# Patient Record
Sex: Female | Born: 1945 | Race: White | Hispanic: No | Marital: Single
Health system: Southern US, Community
[De-identification: ages and names within clinical notes are randomized; demographics above are authoritative.]

---

## 2005-04-27 ENCOUNTER — Emergency Department (HOSPITAL_COMMUNITY): Admission: EM | Admit: 2005-04-27 | Discharge: 2005-04-28 | Payer: Self-pay | Admitting: Emergency Medicine

## 2005-05-09 ENCOUNTER — Emergency Department (HOSPITAL_COMMUNITY): Admission: EM | Admit: 2005-05-09 | Discharge: 2005-05-09 | Payer: Self-pay | Admitting: Emergency Medicine

## 2005-05-25 ENCOUNTER — Encounter: Admission: RE | Admit: 2005-05-25 | Discharge: 2005-07-26 | Payer: Self-pay | Admitting: Family Medicine

## 2006-02-01 ENCOUNTER — Ambulatory Visit: Payer: Self-pay | Admitting: Internal Medicine

## 2006-02-14 ENCOUNTER — Ambulatory Visit: Payer: Self-pay | Admitting: General Surgery

## 2006-03-12 ENCOUNTER — Ambulatory Visit: Payer: Self-pay | Admitting: General Surgery

## 2006-03-29 ENCOUNTER — Ambulatory Visit: Payer: Self-pay | Admitting: Oncology

## 2006-04-06 ENCOUNTER — Ambulatory Visit: Payer: Self-pay | Admitting: Oncology

## 2006-04-09 ENCOUNTER — Ambulatory Visit: Payer: Self-pay | Admitting: Gastroenterology

## 2006-04-23 ENCOUNTER — Ambulatory Visit: Payer: Self-pay | Admitting: Gastroenterology

## 2006-04-27 ENCOUNTER — Ambulatory Visit: Payer: Self-pay | Admitting: Oncology

## 2006-05-27 ENCOUNTER — Ambulatory Visit: Payer: Self-pay | Admitting: Oncology

## 2006-06-27 ENCOUNTER — Ambulatory Visit: Payer: Self-pay | Admitting: Oncology

## 2006-07-28 ENCOUNTER — Ambulatory Visit: Payer: Self-pay | Admitting: Oncology

## 2006-08-31 ENCOUNTER — Ambulatory Visit: Payer: Self-pay | Admitting: Oncology

## 2006-09-06 ENCOUNTER — Ambulatory Visit: Payer: Self-pay | Admitting: Oncology

## 2006-09-27 ENCOUNTER — Ambulatory Visit: Payer: Self-pay | Admitting: Oncology

## 2006-11-08 ENCOUNTER — Ambulatory Visit: Payer: Self-pay | Admitting: Oncology

## 2006-11-27 ENCOUNTER — Ambulatory Visit: Payer: Self-pay | Admitting: Oncology

## 2007-01-26 ENCOUNTER — Ambulatory Visit: Payer: Self-pay | Admitting: Internal Medicine

## 2007-01-31 ENCOUNTER — Ambulatory Visit: Payer: Self-pay | Admitting: Oncology

## 2007-02-05 ENCOUNTER — Ambulatory Visit: Payer: Self-pay | Admitting: Internal Medicine

## 2007-02-26 ENCOUNTER — Ambulatory Visit: Payer: Self-pay | Admitting: Oncology

## 2007-03-12 ENCOUNTER — Emergency Department: Payer: Self-pay

## 2007-03-17 ENCOUNTER — Emergency Department: Payer: Self-pay | Admitting: Emergency Medicine

## 2007-04-08 ENCOUNTER — Ambulatory Visit: Payer: Self-pay | Admitting: Urology

## 2007-04-26 ENCOUNTER — Ambulatory Visit: Payer: Self-pay | Admitting: Internal Medicine

## 2007-04-28 ENCOUNTER — Ambulatory Visit: Payer: Self-pay | Admitting: Oncology

## 2007-05-01 ENCOUNTER — Ambulatory Visit: Payer: Self-pay | Admitting: Oncology

## 2007-05-01 ENCOUNTER — Ambulatory Visit: Payer: Self-pay | Admitting: Rheumatology

## 2007-05-13 ENCOUNTER — Ambulatory Visit: Payer: Self-pay | Admitting: Gastroenterology

## 2007-05-17 ENCOUNTER — Ambulatory Visit: Payer: Self-pay | Admitting: Gastroenterology

## 2007-05-28 ENCOUNTER — Ambulatory Visit: Payer: Self-pay | Admitting: Oncology

## 2007-06-17 ENCOUNTER — Other Ambulatory Visit: Payer: Self-pay

## 2007-06-24 ENCOUNTER — Ambulatory Visit: Payer: Self-pay | Admitting: Gastroenterology

## 2007-06-28 ENCOUNTER — Ambulatory Visit: Payer: Self-pay | Admitting: Oncology

## 2007-07-04 ENCOUNTER — Ambulatory Visit: Payer: Self-pay | Admitting: Rheumatology

## 2007-07-05 ENCOUNTER — Emergency Department: Payer: Self-pay | Admitting: Emergency Medicine

## 2007-07-05 ENCOUNTER — Other Ambulatory Visit: Payer: Self-pay

## 2007-07-06 ENCOUNTER — Emergency Department: Payer: Self-pay | Admitting: Internal Medicine

## 2007-07-06 ENCOUNTER — Other Ambulatory Visit: Payer: Self-pay

## 2007-07-09 ENCOUNTER — Ambulatory Visit: Payer: Self-pay | Admitting: Internal Medicine

## 2007-07-29 ENCOUNTER — Ambulatory Visit: Payer: Self-pay | Admitting: Oncology

## 2007-08-02 ENCOUNTER — Ambulatory Visit: Payer: Self-pay | Admitting: Oncology

## 2007-08-28 ENCOUNTER — Ambulatory Visit: Payer: Self-pay | Admitting: Oncology

## 2007-10-28 ENCOUNTER — Ambulatory Visit: Payer: Self-pay | Admitting: Oncology

## 2007-11-01 ENCOUNTER — Ambulatory Visit: Payer: Self-pay | Admitting: Oncology

## 2007-11-24 ENCOUNTER — Emergency Department: Payer: Self-pay | Admitting: Emergency Medicine

## 2007-11-28 ENCOUNTER — Ambulatory Visit: Payer: Self-pay | Admitting: Oncology

## 2007-12-29 ENCOUNTER — Ambulatory Visit: Payer: Self-pay | Admitting: Oncology

## 2008-01-08 ENCOUNTER — Ambulatory Visit: Payer: Self-pay | Admitting: Otolaryngology

## 2008-01-26 ENCOUNTER — Ambulatory Visit: Payer: Self-pay | Admitting: Oncology

## 2008-03-27 ENCOUNTER — Ambulatory Visit: Payer: Self-pay | Admitting: Oncology

## 2010-05-27 ENCOUNTER — Ambulatory Visit: Payer: Self-pay | Admitting: Oncology

## 2010-06-23 ENCOUNTER — Ambulatory Visit: Payer: Self-pay | Admitting: Oncology

## 2010-06-27 ENCOUNTER — Ambulatory Visit: Payer: Self-pay | Admitting: Oncology

## 2010-08-08 ENCOUNTER — Ambulatory Visit: Payer: Self-pay | Admitting: Unknown Physician Specialty

## 2010-08-18 ENCOUNTER — Inpatient Hospital Stay: Payer: Self-pay | Admitting: Unknown Physician Specialty

## 2010-09-21 ENCOUNTER — Ambulatory Visit: Payer: Self-pay | Admitting: Oncology

## 2010-09-23 ENCOUNTER — Ambulatory Visit: Payer: Self-pay | Admitting: Oncology

## 2010-09-28 ENCOUNTER — Emergency Department: Payer: Self-pay | Admitting: Emergency Medicine

## 2010-09-29 ENCOUNTER — Emergency Department: Payer: Self-pay | Admitting: Emergency Medicine

## 2010-09-30 ENCOUNTER — Ambulatory Visit: Payer: Self-pay | Admitting: Oncology

## 2010-10-27 ENCOUNTER — Ambulatory Visit: Payer: Self-pay | Admitting: Oncology

## 2011-04-10 ENCOUNTER — Ambulatory Visit: Payer: Self-pay | Admitting: Oncology

## 2011-04-28 ENCOUNTER — Ambulatory Visit: Payer: Self-pay | Admitting: Oncology

## 2011-05-05 IMAGING — CR DG CHEST 2V
1 series · 2 of 2 positions shown · non-contrast
Comparison: none

REASON FOR EXAM: fever
COMMENTS:

[Series 1: view not recorded · 0.17mm/px · 2 of 2 slices shown]
[im 1/2]
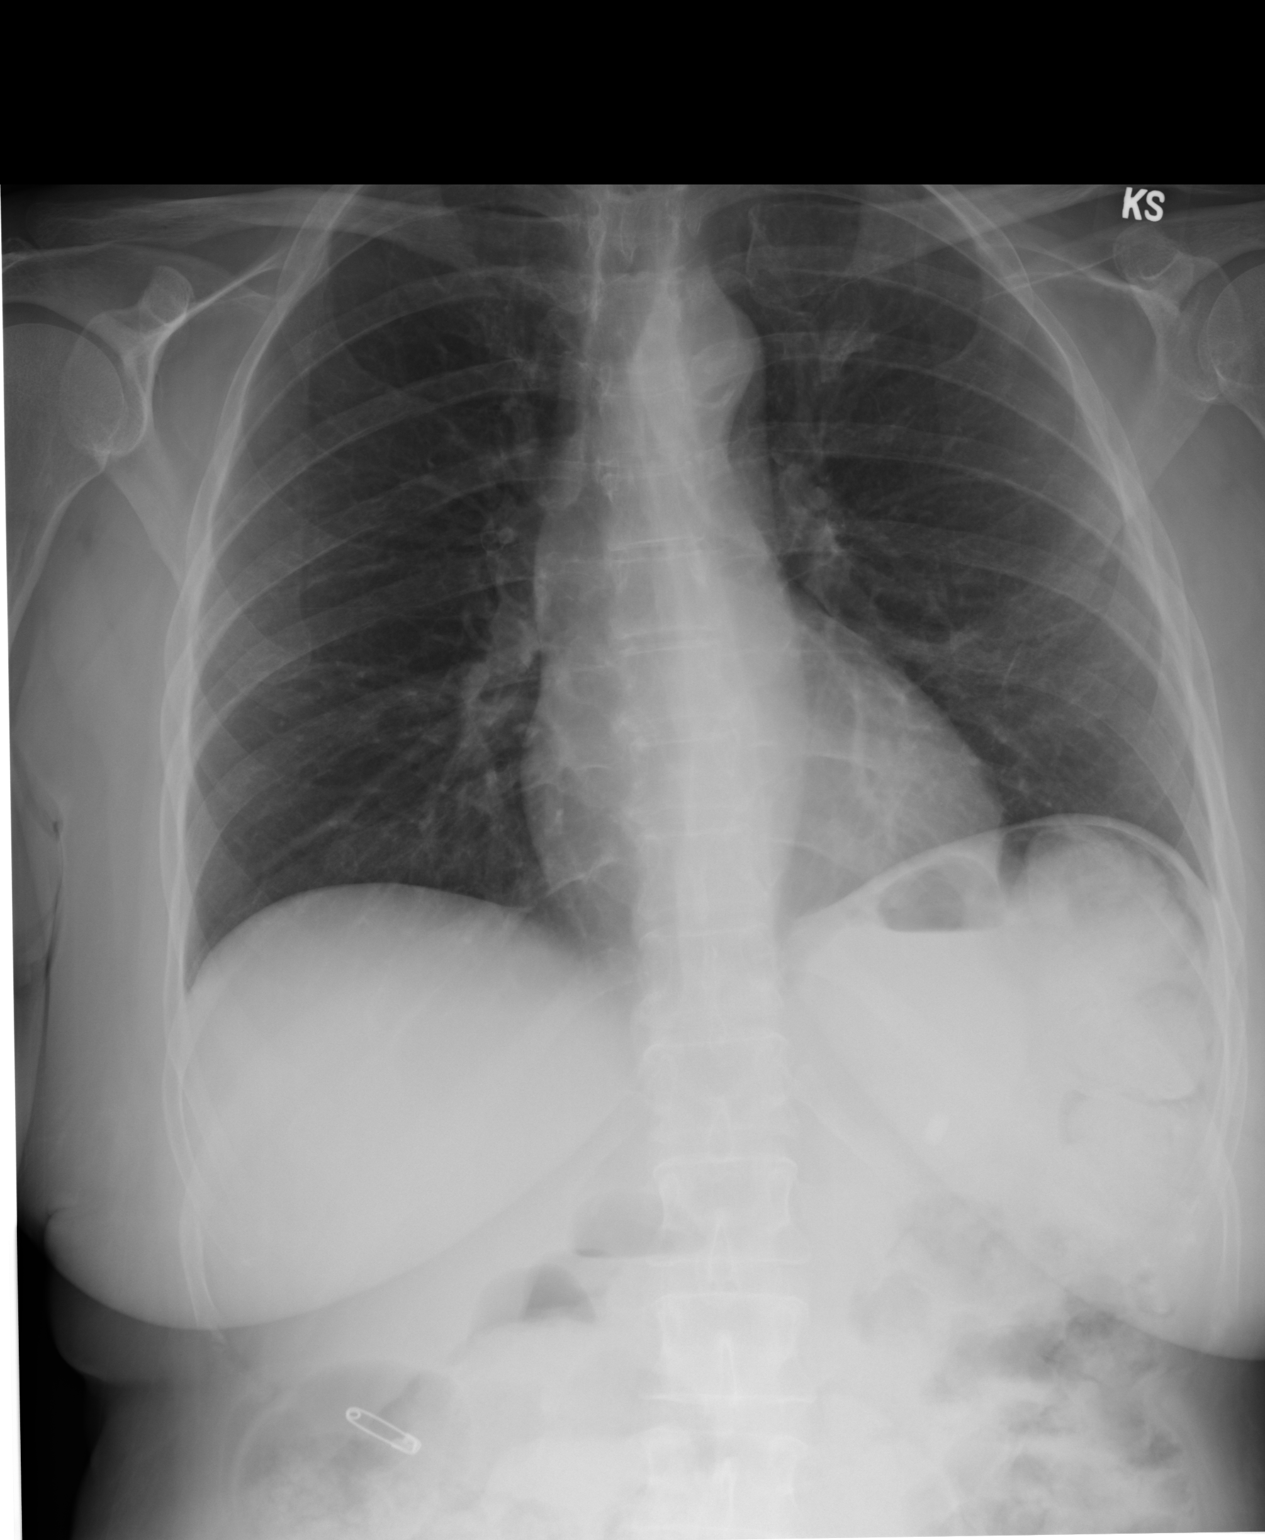
[im 2/2]
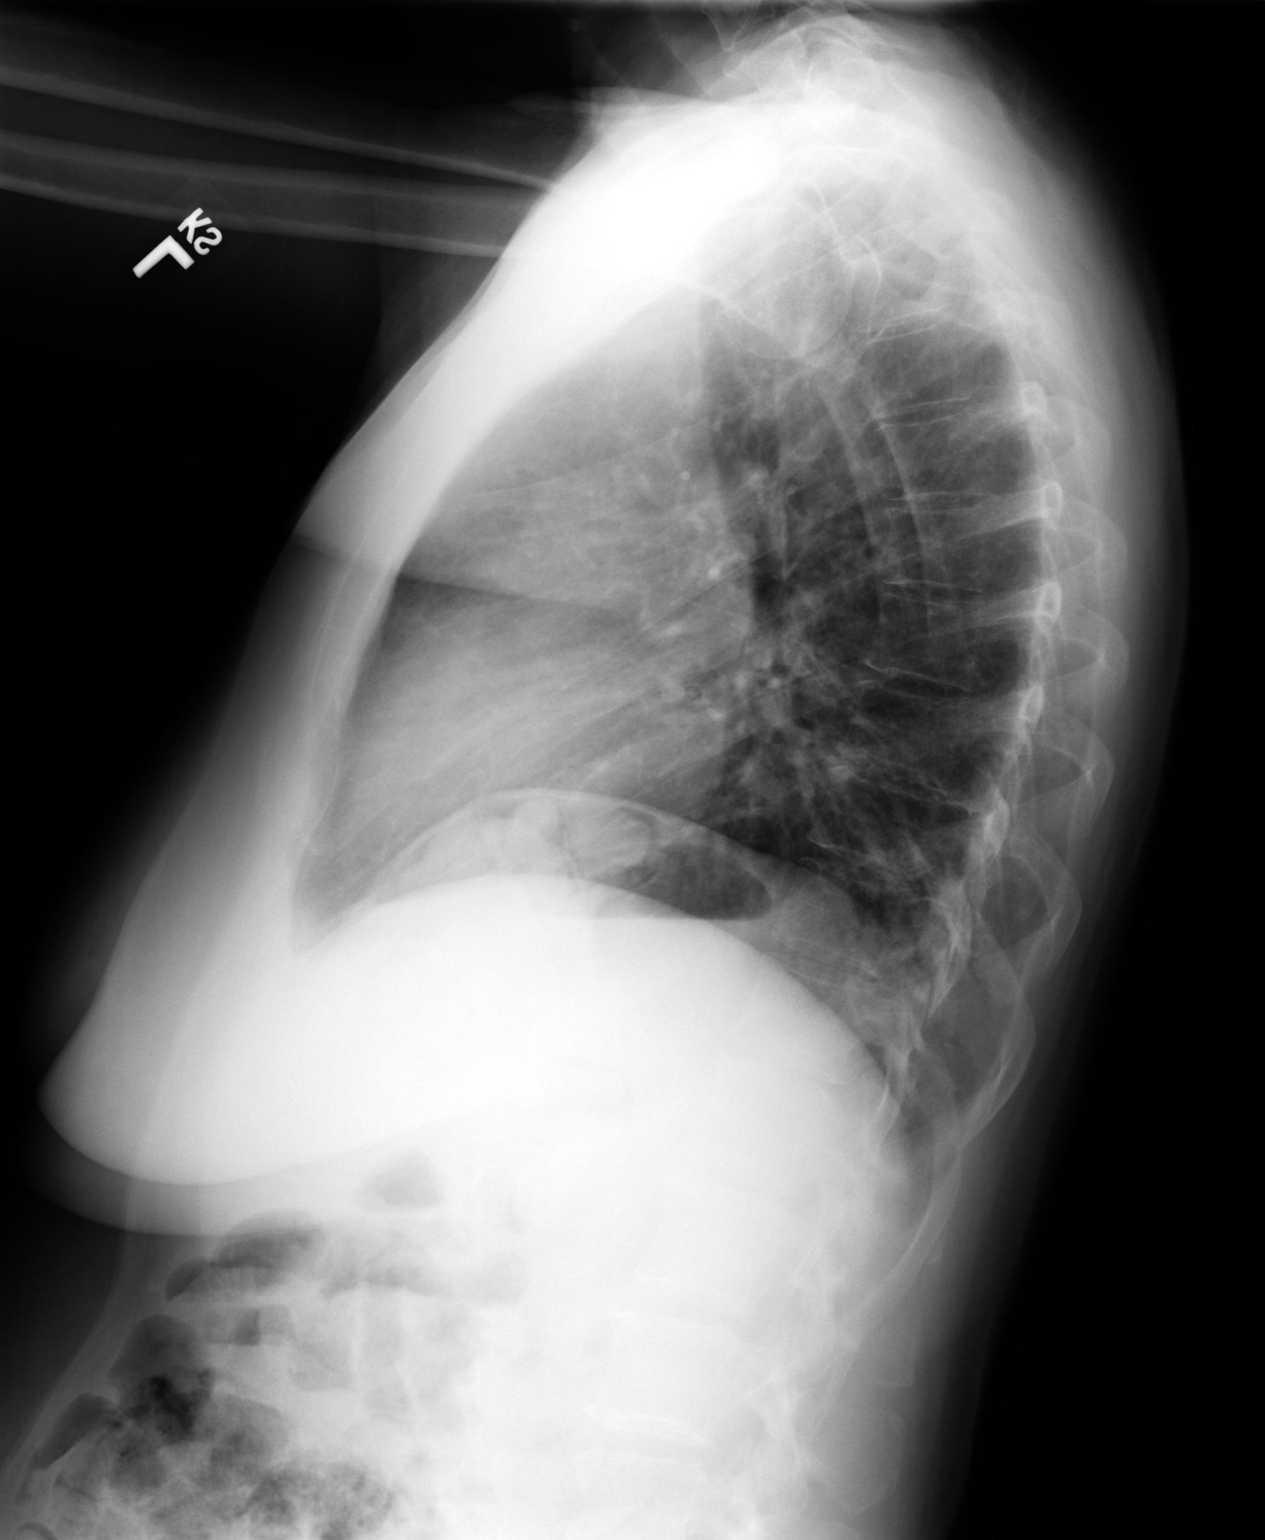

[2 of 2 positions shown; findings below may reference images not displayed]

PROCEDURE:     DXR - DXR CHEST PA (OR AP) AND LATERAL  - August 21, 2010  [DATE]

RESULT:     Comparison is made to the study dated 08 August, 2010.

The lungs are adequately inflated. There is no focal infiltrate. The cardiac
silhouette is normal in size. The pulmonary vascularity is not engorged.
There is no pleural effusion. The mediastinum is not abnormally widened.
IMPRESSION: I do not see evidence of pneumonia nor definite evidence of
other acute cardiopulmonary abnormality.

## 2011-07-17 ENCOUNTER — Ambulatory Visit: Payer: Self-pay | Admitting: Unknown Physician Specialty

## 2011-08-03 ENCOUNTER — Emergency Department: Payer: Self-pay | Admitting: *Deleted

## 2011-08-11 ENCOUNTER — Ambulatory Visit: Payer: Self-pay | Admitting: Oncology

## 2011-08-28 ENCOUNTER — Ambulatory Visit: Payer: Self-pay | Admitting: Oncology

## 2011-08-29 ENCOUNTER — Ambulatory Visit: Payer: Self-pay | Admitting: Oncology

## 2011-09-04 ENCOUNTER — Emergency Department: Payer: Self-pay | Admitting: Emergency Medicine

## 2011-09-20 ENCOUNTER — Emergency Department: Payer: Self-pay | Admitting: Emergency Medicine

## 2011-09-21 ENCOUNTER — Emergency Department: Payer: Self-pay | Admitting: Emergency Medicine

## 2011-09-28 ENCOUNTER — Ambulatory Visit: Payer: Self-pay | Admitting: Oncology

## 2011-11-14 ENCOUNTER — Ambulatory Visit: Payer: Self-pay | Admitting: Neurology

## 2011-11-17 ENCOUNTER — Ambulatory Visit: Payer: Self-pay | Admitting: Neurology

## 2011-11-29 ENCOUNTER — Ambulatory Visit: Payer: Self-pay | Admitting: Oncology

## 2011-11-29 LAB — COMPREHENSIVE METABOLIC PANEL
Albumin: 3.9 g/dL (ref 3.4–5.0)
Alkaline Phosphatase: 117 U/L (ref 50–136)
Anion Gap: 11 (ref 7–16)
BUN: 20 mg/dL — ABNORMAL HIGH (ref 7–18)
Calcium, Total: 8.7 mg/dL (ref 8.5–10.1)
Chloride: 103 mmol/L (ref 98–107)
Co2: 26 mmol/L (ref 21–32)
Creatinine: 0.88 mg/dL (ref 0.60–1.30)
Osmolality: 283 (ref 275–301)
Sodium: 140 mmol/L (ref 136–145)
Total Protein: 7.7 g/dL (ref 6.4–8.2)

## 2011-11-29 LAB — CBC CANCER CENTER
Basophil #: 0 x10 3/mm (ref 0.0–0.1)
Eosinophil #: 0 x10 3/mm (ref 0.0–0.7)
HGB: 13.6 g/dL (ref 12.0–16.0)
Lymphocyte #: 1.7 x10 3/mm (ref 1.0–3.6)
Lymphocyte %: 30.7 %
MCH: 30.7 pg (ref 26.0–34.0)
MCHC: 33.7 g/dL (ref 32.0–36.0)
Neutrophil #: 3.1 x10 3/mm (ref 1.4–6.5)
RDW: 14.2 % (ref 11.5–14.5)

## 2011-12-22 ENCOUNTER — Ambulatory Visit: Payer: Self-pay | Admitting: Unknown Physician Specialty

## 2011-12-29 ENCOUNTER — Ambulatory Visit: Payer: Self-pay | Admitting: Oncology

## 2012-01-17 ENCOUNTER — Other Ambulatory Visit: Payer: Self-pay | Admitting: Hematology & Oncology

## 2012-01-30 ENCOUNTER — Ambulatory Visit: Payer: Self-pay | Admitting: Oncology

## 2012-01-30 ENCOUNTER — Ambulatory Visit: Payer: Self-pay | Admitting: Gastroenterology

## 2012-01-30 LAB — COMPREHENSIVE METABOLIC PANEL
Alkaline Phosphatase: 95 U/L (ref 50–136)
Calcium, Total: 8.9 mg/dL (ref 8.5–10.1)
Co2: 28 mmol/L (ref 21–32)
EGFR (Non-African Amer.): >60
Glucose: 89 mg/dL (ref 65–99)
Osmolality: 279 (ref 275–301)
SGOT(AST): 12 U/L — ABNORMAL LOW (ref 15–37)
Sodium: 140 mmol/L (ref 136–145)

## 2012-01-30 LAB — CBC CANCER CENTER
Basophil #: 0.1 x10 3/mm (ref 0.0–0.1)
Eosinophil #: 0 x10 3/mm (ref 0.0–0.7)
HGB: 12.9 g/dL (ref 12.0–16.0)
MCH: 30.9 pg (ref 26.0–34.0)
MCHC: 34.3 g/dL (ref 32.0–36.0)
Monocyte #: 0.4 x10 3/mm (ref 0.0–0.7)
Neutrophil %: 64.2 %
Platelet: 190 x10 3/mm (ref 150–440)
RDW: 14 % (ref 11.5–14.5)

## 2012-01-31 ENCOUNTER — Ambulatory Visit: Payer: Self-pay | Admitting: Gastroenterology

## 2012-02-01 LAB — PATHOLOGY REPORT

## 2012-02-26 ENCOUNTER — Ambulatory Visit: Payer: Self-pay | Admitting: Oncology

## 2012-05-18 IMAGING — CR DG CHEST 1V PORT
1 series · 1 of 1 positions shown · non-contrast
Comparison: none

REASON FOR EXAM: altered mental status
COMMENTS:

PROCEDURE:     DXR - DXR PORTABLE CHEST SINGLE VIEW  - September 04, 2011  [DATE]
RESULT:     Comparison: 08/11/2011

[view not recorded]
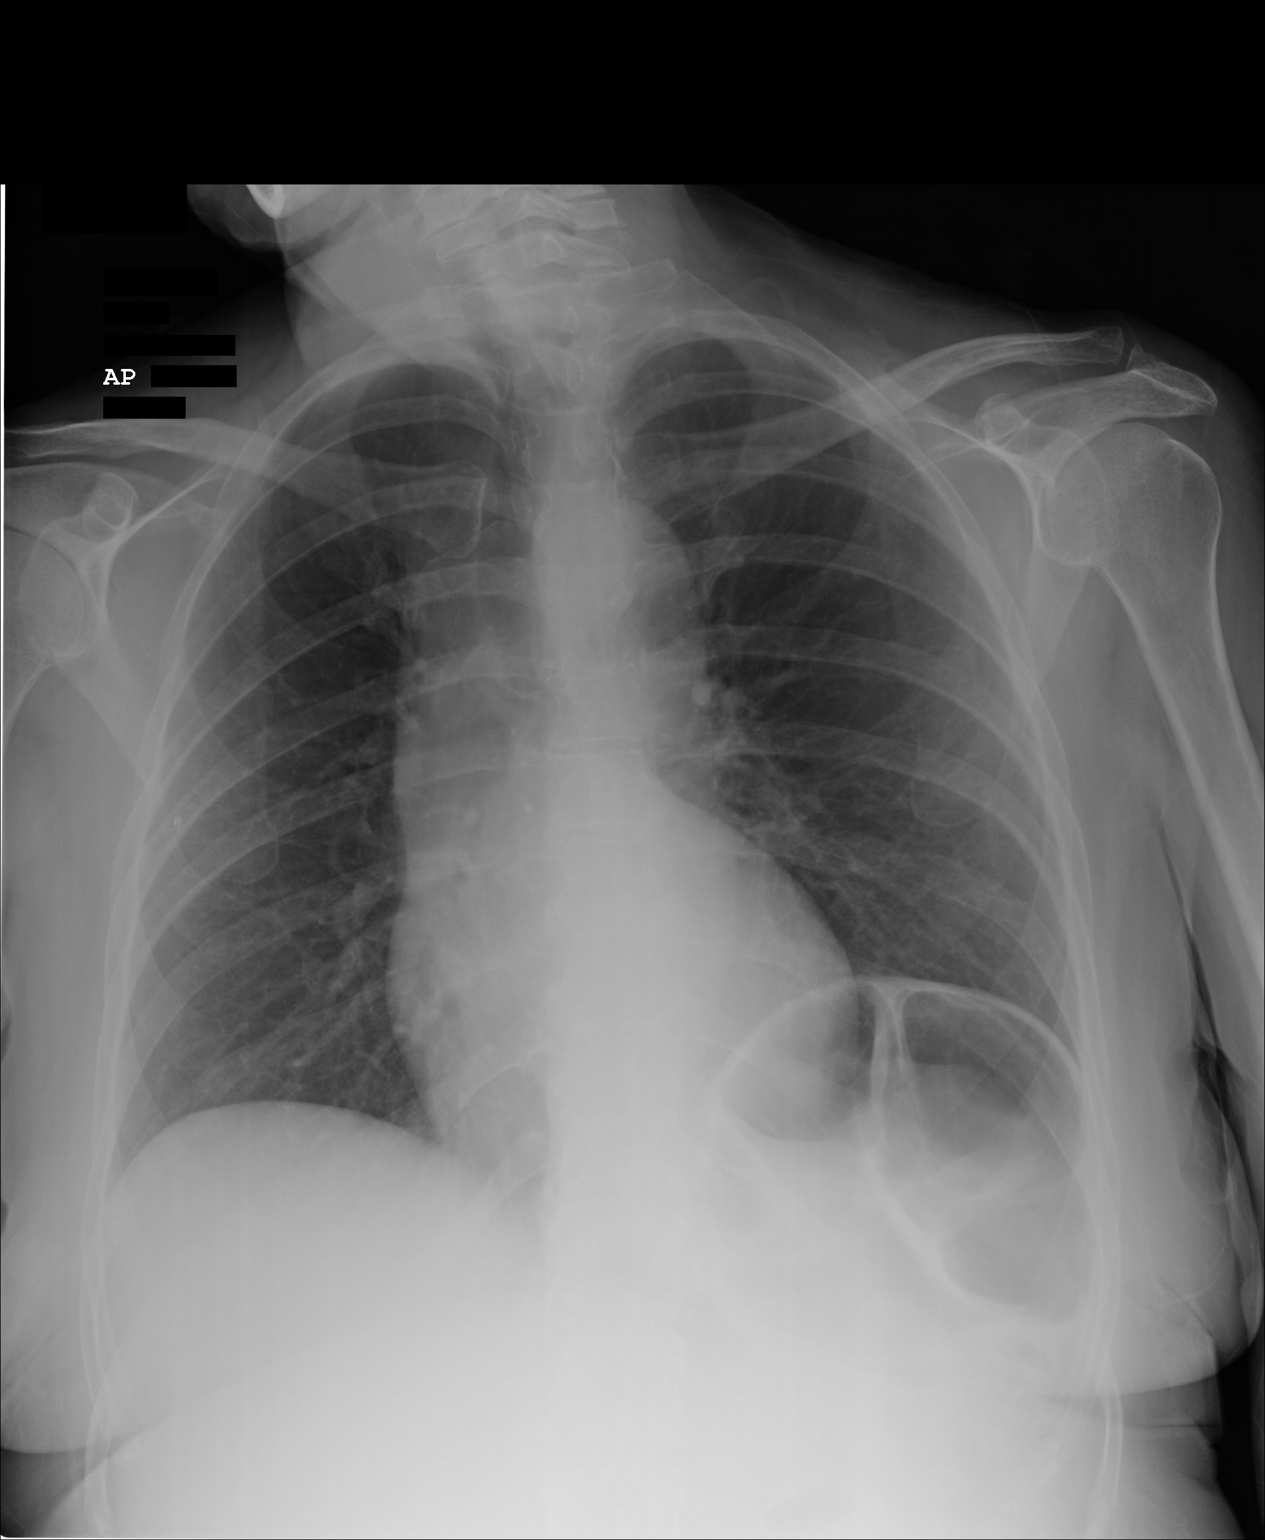

[1 of 1 positions shown; findings below may reference images not displayed]

FINDINGS: There is mild elevation of the left hemidiaphragm. Heart and mediastinum are
stable given patient rotation to the right. No focal pulmonary opacities.
IMPRESSION: No acute cardiopulmonary disease.

## 2012-05-18 IMAGING — CT CT HEAD WITHOUT CONTRAST
2 series · 16 of 30 positions shown, 20 images · non-contrast
Comparison: none

REASON FOR EXAM: syncope, slurred speech
COMMENTS:

PROCEDURE:     CT  - CT HEAD WITHOUT CONTRAST  - September 04, 2011  [DATE]
RESULT:     Head CT dated 09/04/2011 comparison made to prior study dated
08/03/2011 .
TECHNIQUE: Helical noncontrasted 5 mm sections were obtained from the skull
base to the vertex.

[Series 2: without · axial · non-contrast · 0.39mm/px · z∈[-150,-30]mm · 13 of 29 slices shown, 17 images]
[im 3/29  brain]
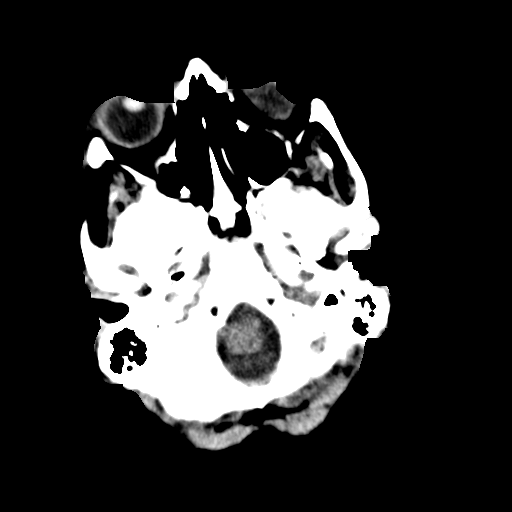
[im 3/29  bone]
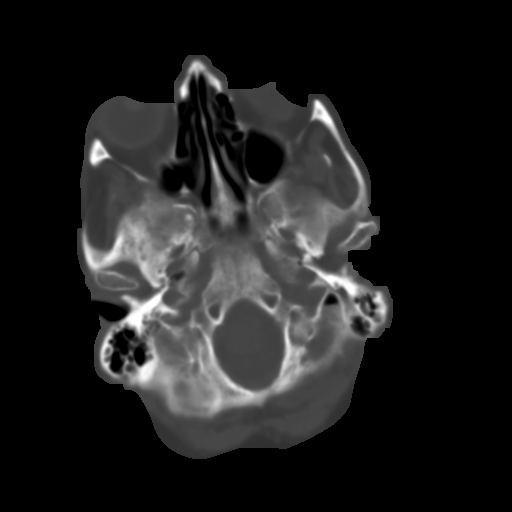
[im 5/29  brain]
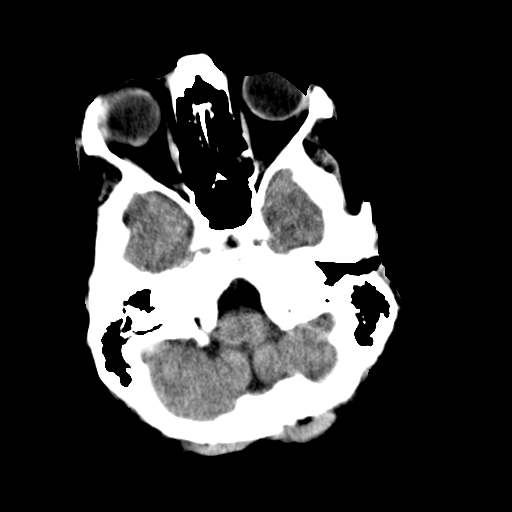
[im 7/29  brain]
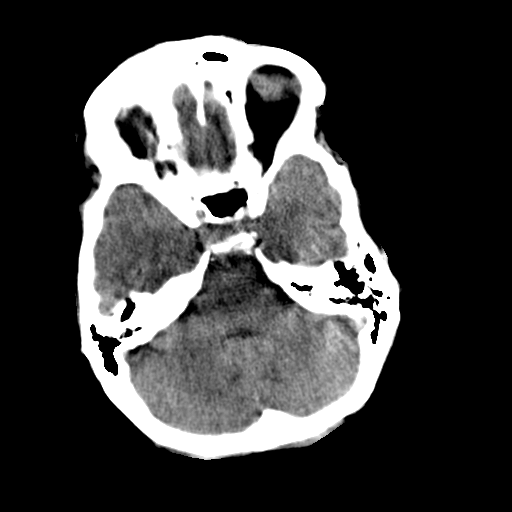
[im 9/29  brain]
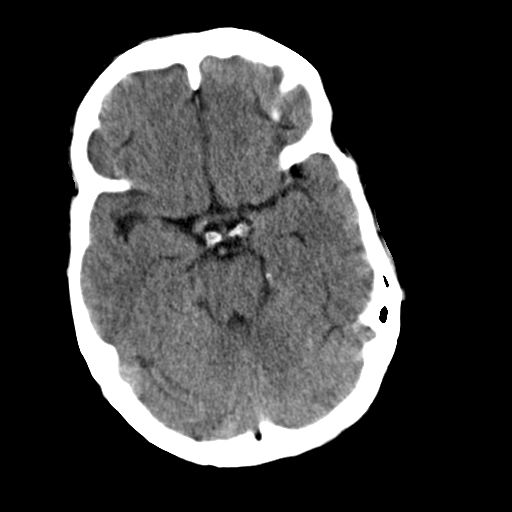
[im 11/29  brain]
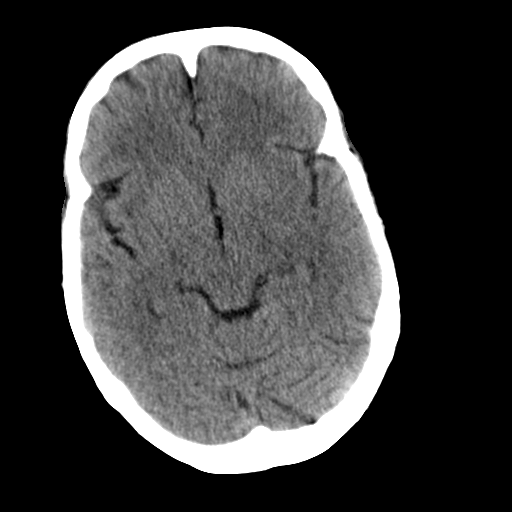
[im 11/29  bone]
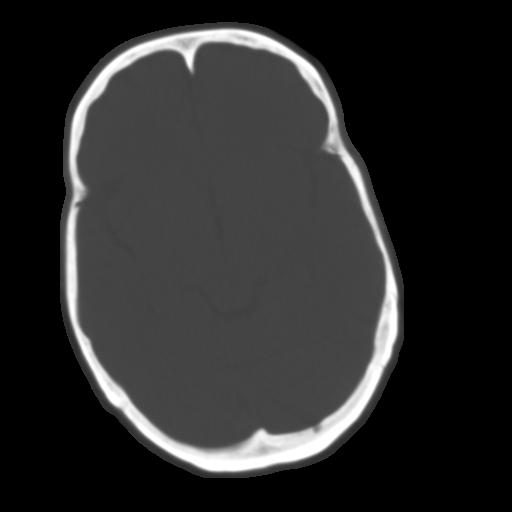
[im 13/29  brain]
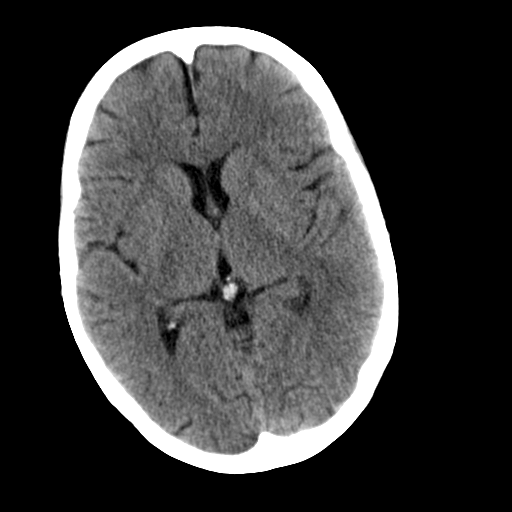
[im 15/29  brain]
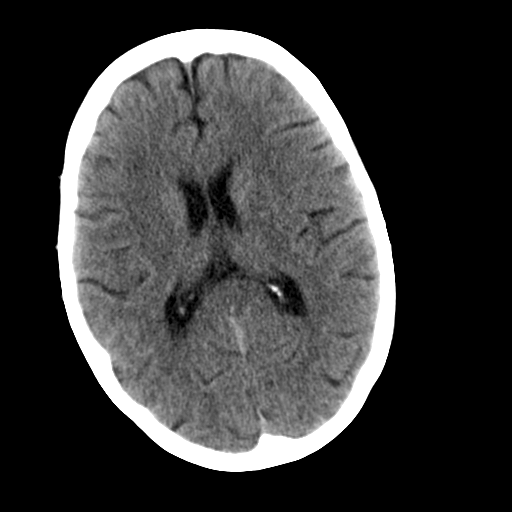
[im 17/29  brain]
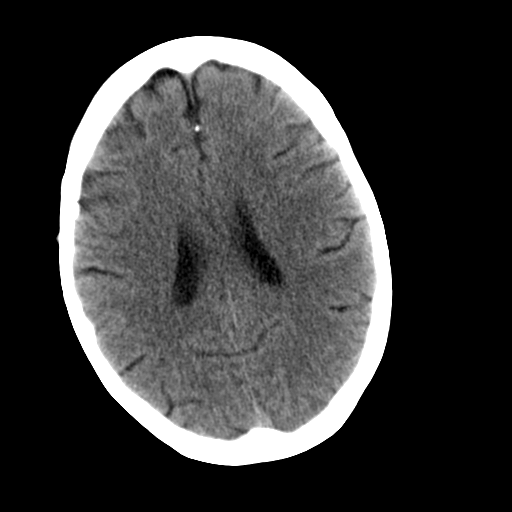
[im 19/29  brain]
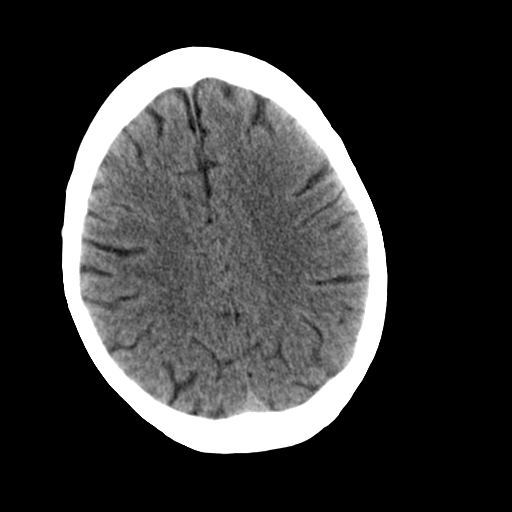
[im 19/29  bone]
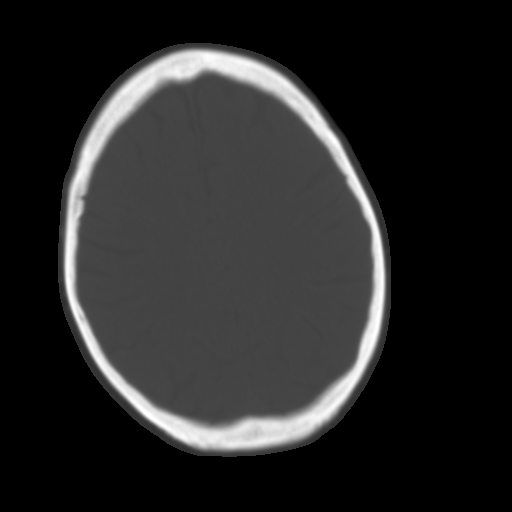
[im 21/29  brain]
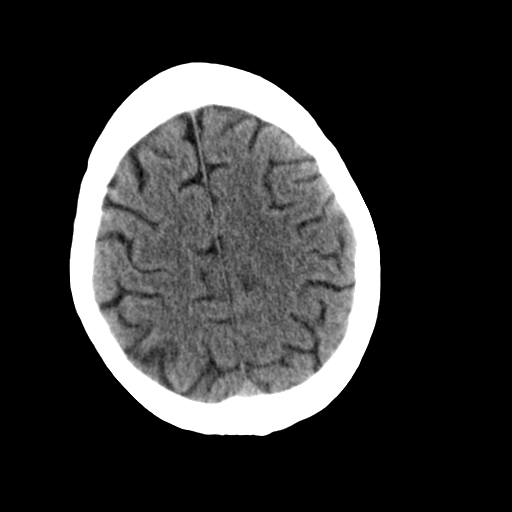
[im 23/29  brain]
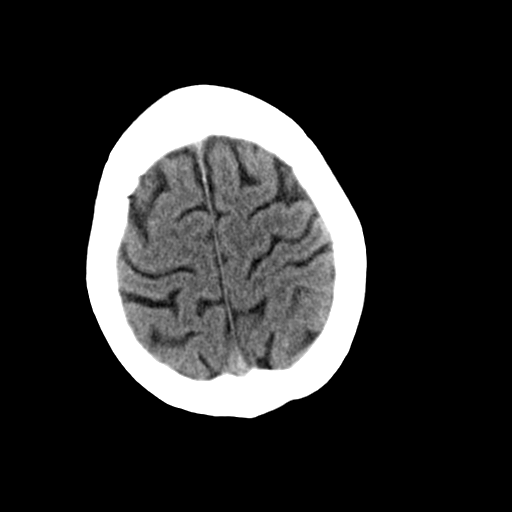
[im 25/29  brain]
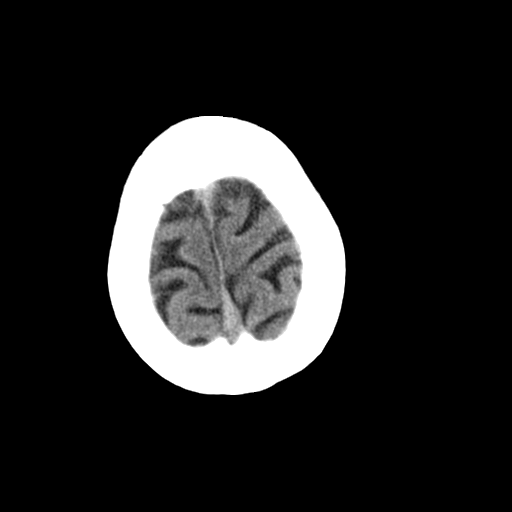
[im 27/29  brain]
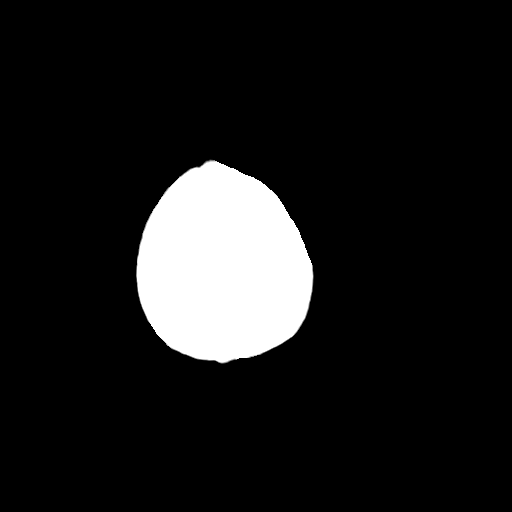
[im 27/29  bone]
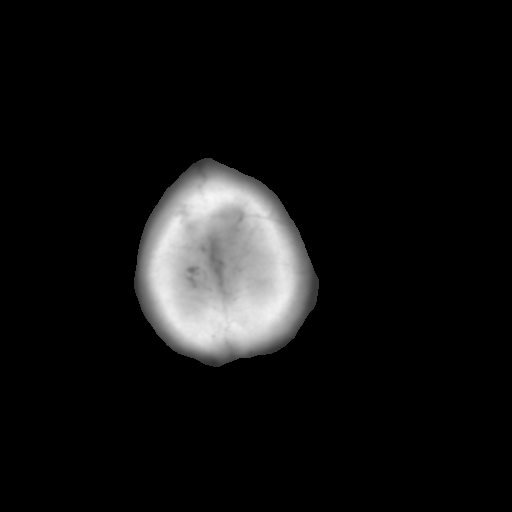

[Series 3: bone · axial · 0.39mm/px · z∈[-150,-110]mm · 3 of 29 slices shown]
[im 3/29  bone]
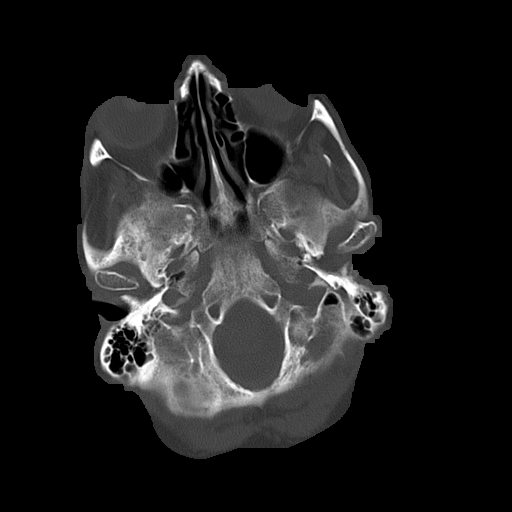
[im 7/29  bone]
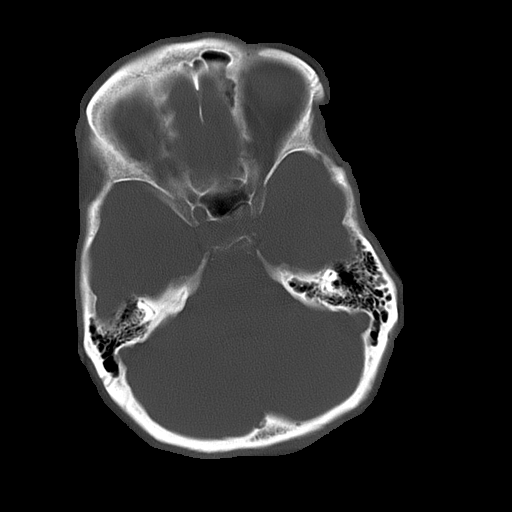
[im 11/29  bone]
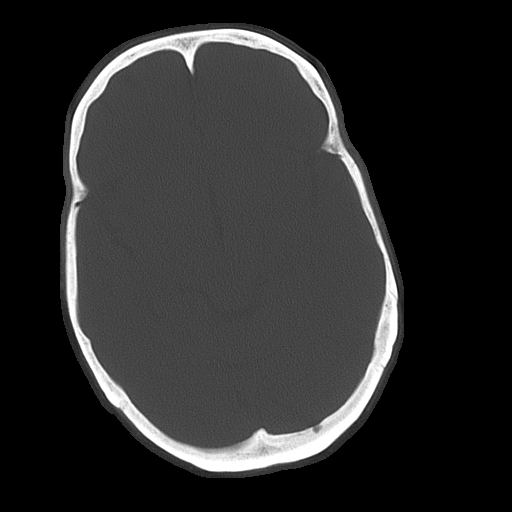

[16 of 30 positions shown; findings below may reference images not displayed]

FINDINGS: There is no evidence of intra-axial nor extra-axial fluid
collections no evidence of acute hemorrhage. Mild areas of low attenuation
project within the subcortical white matter regions. The ventricles and
cisterns are patent. There is no evidence of subfalcine nor tonsillar
herniation. Basal ganglial calcifications are appreciated. There is no
evidence of a depressed skull fracture. Periorbital swelling is appreciated
on the right.
IMPRESSION: No evidence of acute intracranial abnormalities.

## 2015-03-21 NOTE — Op Note (Signed)
PATIENT NAME:  Melissa Obrien, Melissa Obrien MR#:  [id number] DATE OF BIRTH:  [birth year]  DATE OF PROCEDURE:  12/22/2011  PREOPERATIVE DIAGNOSIS: Recalcitrant trochanteric bursitis, right hip.   POSTOPERATIVE DIAGNOSIS: Recalcitrant trochanteric bursitis, right hip.  PROCEDURES:  1. Arthroscopy of right hip area.  2. Arthroscopic excision of trochanteric bursa and release of iliotibial band, right hip.   SURGEON: Winn JockJames C. Toris Laverdiere, MD   ASSISTANT: None.   ANESTHESIA: General.   ESTIMATED BLOOD LOSS: Negligible.   COMPLICATIONS: None.   BRIEF CLINICAL NOTE AND PATHOLOGY: The patient had recalcitrant trochanteric bursitis, right hip, which responded to injections. This became a recurrent issue. Options, risks, and benefits were discussed and she elected to proceed with operative intervention. At time of the procedure, there was marked thickening of the bursa. There was scarring.   PROCEDURE: Preop antibiotics, adequate general anesthesia, left lateral decubitus position with beanbag, all prominences well padded. Routine prepping and draping. Appropriate time-out was called.   The maximum prominence was identified with a spinal needle. Incisions were made proximal and distal and the subcutaneous tissue was carefully dissected with a blunt trocar. The arthroscope was then introduced and a cautery unit introduced from the opposite portal. The soft tissue was removed from on top of the iliotibial band. This area was then incised and split. The bursa was then removed both with a shaver and with the cautery unit. Hemostasis was obtained with the cautery unit. The area was thoroughly reinspected. The hip was taken through a range of motion. No further compression was noted. The arthroscope was placed through the opposite portal and the area was again checked with no other findings. The incisions were closed with nylon. The area was injected with Marcaine and epinephrine. Soft sterile dressing was applied. The  patient was awakened and taken to the PAC-U having tolerated the procedure well.   ____________________________ Winn JockJames C. Gerrit Heckaliff, MD jcc:drc D: 12/22/2011 12:49:04 ET T: 12/22/2011 12:59:10 ET JOB#: [id number]  cc: Winn JockJames C. Gerrit Heckaliff, MD, <Dictator> Winn JockJAMES C Marlis Oldaker MD ELECTRONICALLY SIGNED 12/24/2011 14:44
